# Patient Record
Sex: Female | Born: 1935 | Race: White | Hispanic: No | Smoking: Never smoker
Health system: Southern US, Community
[De-identification: ages and names within clinical notes are randomized; demographics above are authoritative.]

## PROBLEM LIST (undated history)

## (undated) DIAGNOSIS — C801 Malignant (primary) neoplasm, unspecified: Secondary | ICD-10-CM

## (undated) DIAGNOSIS — E785 Hyperlipidemia, unspecified: Secondary | ICD-10-CM

## (undated) DIAGNOSIS — E119 Type 2 diabetes mellitus without complications: Secondary | ICD-10-CM

## (undated) HISTORY — DX: Hyperlipidemia, unspecified: E78.5

## (undated) HISTORY — DX: Malignant (primary) neoplasm, unspecified: C80.1

## (undated) HISTORY — DX: Type 2 diabetes mellitus without complications: E11.9

---

## 1974-07-31 HISTORY — PX: BREAST LUMPECTOMY: SHX2

## 1990-09-15 DIAGNOSIS — C801 Malignant (primary) neoplasm, unspecified: Secondary | ICD-10-CM

## 1990-09-15 HISTORY — PX: EXPLORATORY LAPAROTOMY: SUR591

## 1990-09-15 HISTORY — DX: Malignant (primary) neoplasm, unspecified: C80.1

## 1990-09-15 HISTORY — PX: APPENDECTOMY: SHX54

## 1991-01-31 HISTORY — PX: ABDOMINAL HYSTERECTOMY: SHX81

## 2005-01-31 ENCOUNTER — Ambulatory Visit: Payer: Self-pay | Admitting: General Surgery

## 2005-01-31 ENCOUNTER — Other Ambulatory Visit: Payer: Self-pay

## 2005-02-17 ENCOUNTER — Ambulatory Visit: Payer: Self-pay | Admitting: Internal Medicine

## 2005-11-18 ENCOUNTER — Ambulatory Visit: Payer: Self-pay | Admitting: Unknown Physician Specialty

## 2006-09-15 HISTORY — PX: COLONOSCOPY: SHX174

## 2006-11-25 ENCOUNTER — Ambulatory Visit: Payer: Self-pay | Admitting: Family Medicine

## 2006-12-10 ENCOUNTER — Ambulatory Visit: Payer: Self-pay | Admitting: General Surgery

## 2007-06-08 ENCOUNTER — Ambulatory Visit: Payer: Self-pay | Admitting: Family Medicine

## 2007-12-09 ENCOUNTER — Ambulatory Visit: Payer: Self-pay | Admitting: Unknown Physician Specialty

## 2008-12-12 ENCOUNTER — Ambulatory Visit: Payer: Self-pay | Admitting: General Surgery

## 2008-12-19 ENCOUNTER — Inpatient Hospital Stay: Payer: Self-pay | Admitting: General Surgery

## 2008-12-19 HISTORY — PX: CHOLECYSTECTOMY: SHX55

## 2008-12-28 ENCOUNTER — Ambulatory Visit: Payer: Self-pay | Admitting: General Surgery

## 2009-01-08 ENCOUNTER — Ambulatory Visit: Payer: Self-pay | Admitting: General Surgery

## 2009-02-27 ENCOUNTER — Ambulatory Visit: Payer: Self-pay | Admitting: Family Medicine

## 2009-12-24 ENCOUNTER — Ambulatory Visit: Payer: Self-pay | Admitting: Vascular Surgery

## 2010-03-05 ENCOUNTER — Ambulatory Visit: Payer: Self-pay | Admitting: Family Medicine

## 2011-04-16 ENCOUNTER — Ambulatory Visit: Payer: Self-pay | Admitting: Family Medicine

## 2011-05-21 ENCOUNTER — Ambulatory Visit: Payer: Self-pay | Admitting: Family Medicine

## 2012-04-28 ENCOUNTER — Ambulatory Visit: Payer: Self-pay | Admitting: Family Medicine

## 2012-08-04 ENCOUNTER — Ambulatory Visit: Payer: Self-pay | Admitting: General Surgery

## 2014-03-03 IMAGING — CT CT ABD-PELV W/O CM
1 of 2 series · 15 of 32 positions shown, 19 images · non-contrast
Comparison: none

REASON FOR EXAM: abd pain generalized ORAL CONTRAST ONLY
COMMENTS:

PROCEDURE:     KCT - KCT ABDOMEN/PELVIS WO  - August 04, 2012  [DATE]
RESULT:     Comparison: None
TECHNIQUE: Multiple axial images from the lung bases to the symphysis pubis
were obtained without oral and without intravenous contrast.

[Series 2: abd 3mm wo 3.0 i40f 3 · axial · 0.75mm/px · z∈[-922,-586]mm · 15 of 127 slices shown, 19 images]
[im 10/127  soft-tissue]
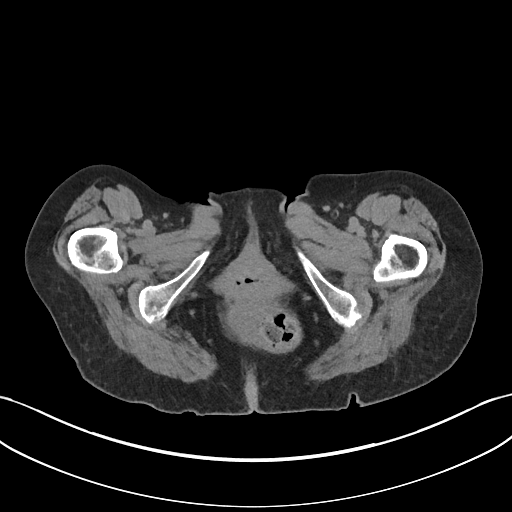
[im 10/127  bone]
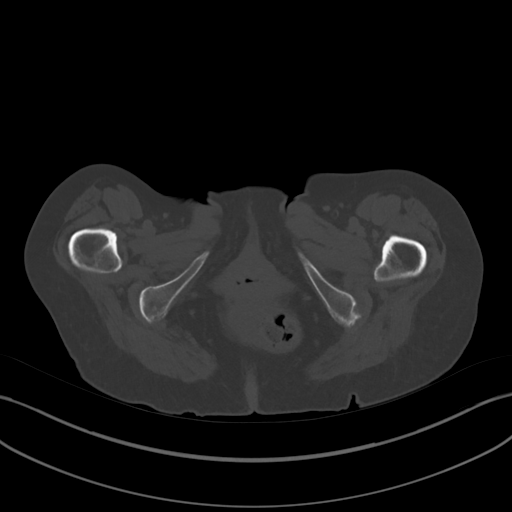
[im 19/127  soft-tissue]
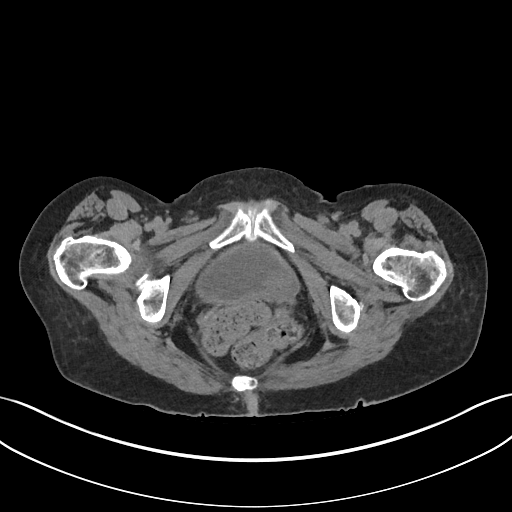
[im 28/127  soft-tissue]
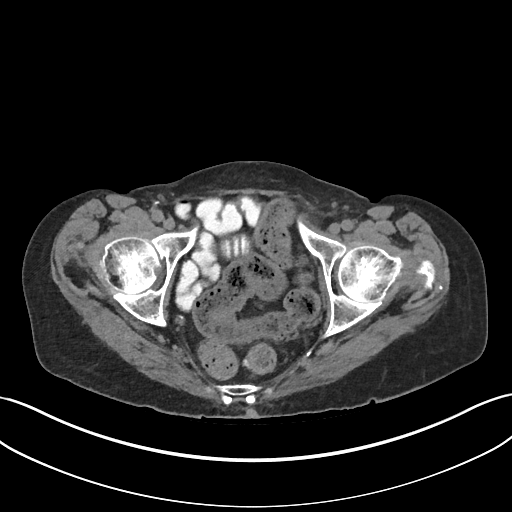
[im 37/127  soft-tissue]
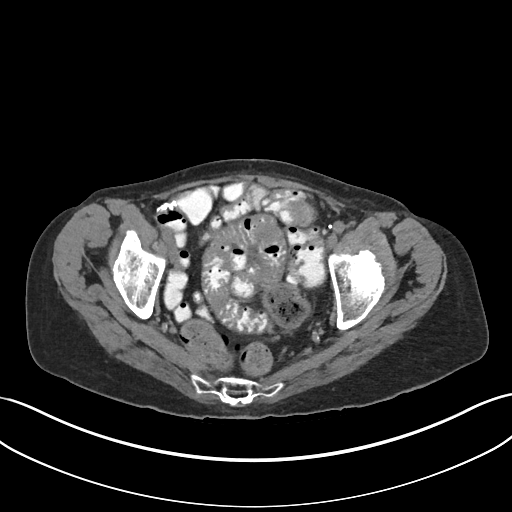
[im 46/127  soft-tissue]
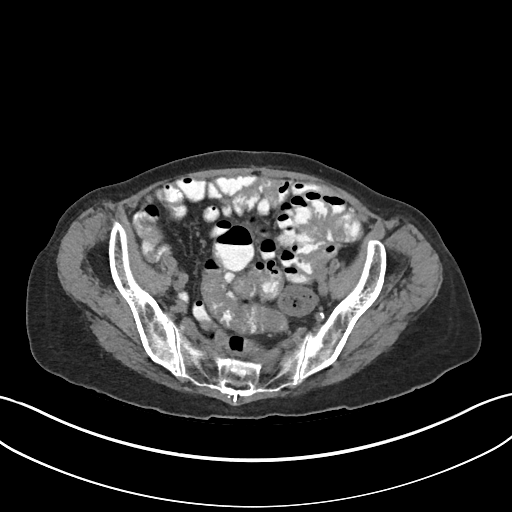
[im 55/127  soft-tissue]
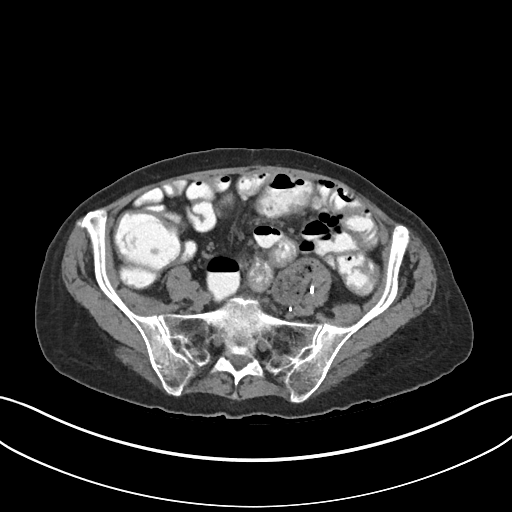
[im 64/127  soft-tissue]
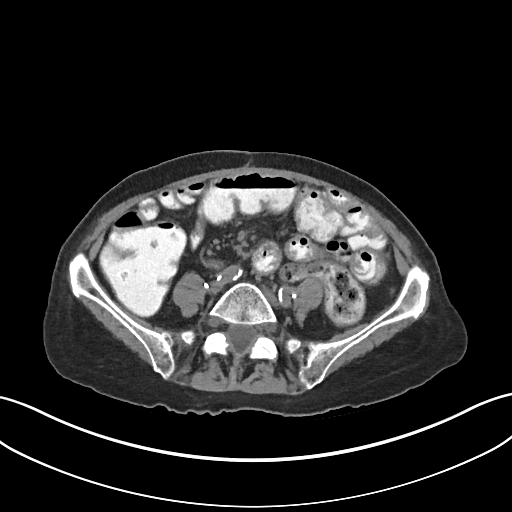
[im 73/127  soft-tissue]
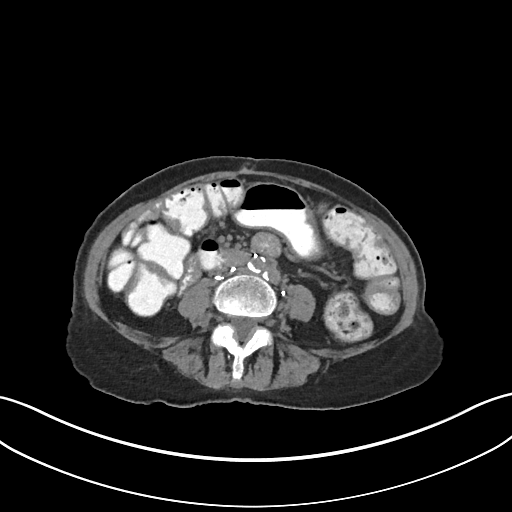
[im 82/127  soft-tissue]
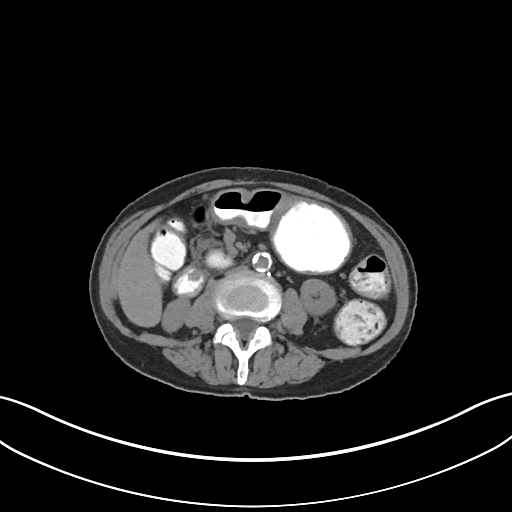
[im 82/127  bone]
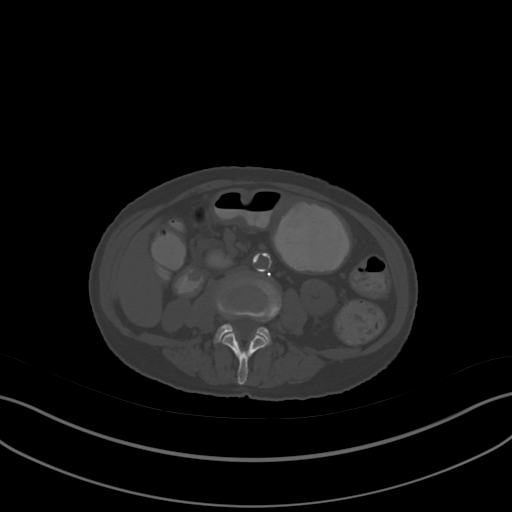
[im 91/127  soft-tissue]
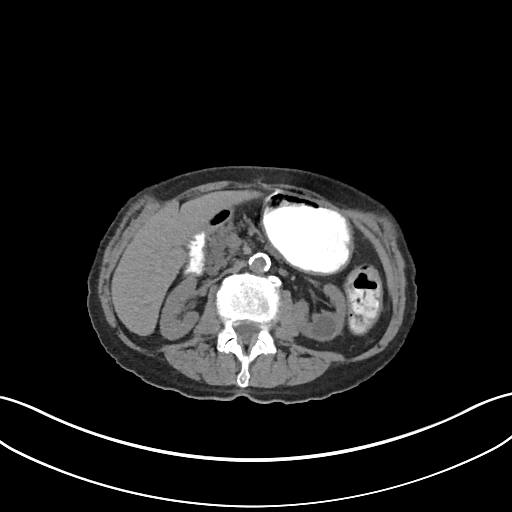
[im 100/127  soft-tissue]
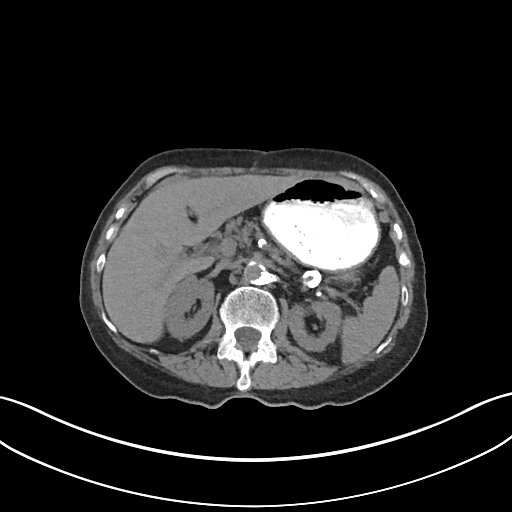
[im 109/127  soft-tissue]
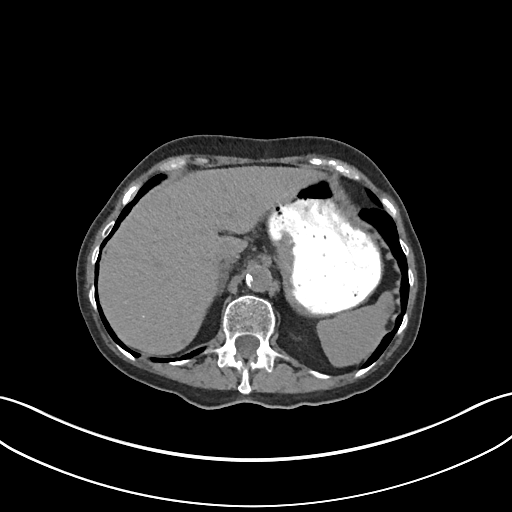
[im 109/127  lung]
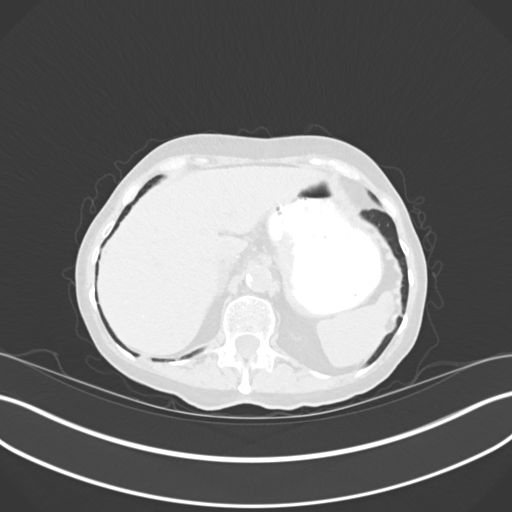
[im 113/127  lung]
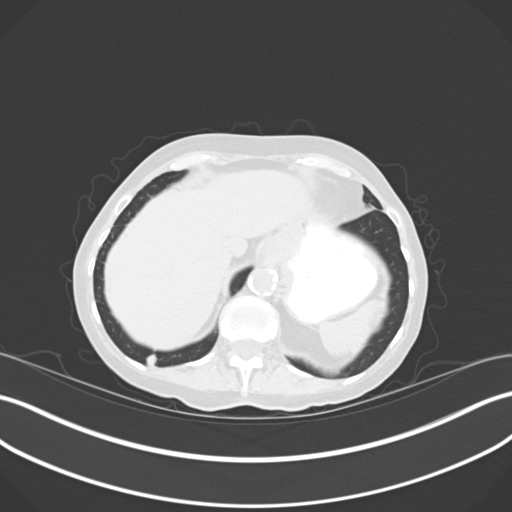
[im 118/127  soft-tissue]
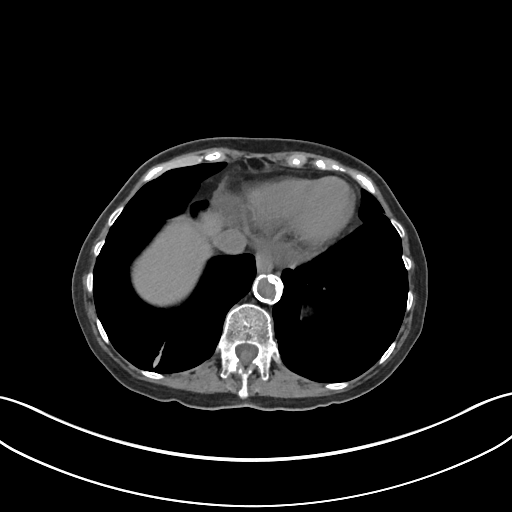
[im 118/127  lung]
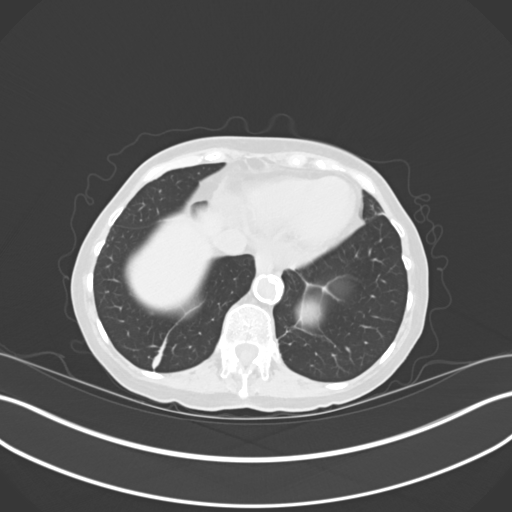
[im 122/127  lung]
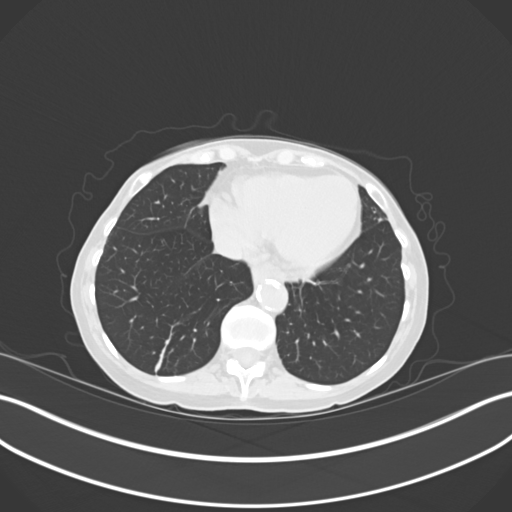

[15 of 32 positions shown; findings below may reference images not displayed]

FINDINGS: Linear opacity in the left lower lobe is likely secondary to atelectasis.

Lack of intravenous contrast limits evaluation of the solid abdominal
organs.  Punctate calcifications in the liver likely represent sequela of
old prior infection. Surgical clips are seen from prior cholecystectomy. The
spleen, adrenals, and pancreas are unremarkable. Small low-attenuation
lesion in the left kidney likely represents a cyst. No renal calculi or
hydronephrosis. There is a round peripherally calcified density just
superior to the pancreatic tail. This likely represents a periphery
calcified splenic artery aneurysm. It measures 1.3 cm in diameter.

Mild thickening at the gastroesophageal junction is likely secondary to
underdistention. The patient is status post hysterectomy. Stool is seen
throughout the sigmoid colon and rectum. The small and large bowel are
normal in caliber. Multiple surgical clips are seen in the anterior left
hemipelvis. There is a bowel suture line in the anterior mid abdomen. The
appendix is not visualized, consistent with reported history of
appendectomy. Multiple surgical clips are seen in the right anterior pelvis.
There is a broad-based right inguinal hernia containing a loop of
nonobstructed small bowel. There are a few diverticula in the sigmoid colon.

No aggressive lytic or sclerotic osseous lesions are identified.
IMPRESSION: 1. No acute findings in the abdomen or pelvis, given the limitation of lack
of intravenous contrast.
2. There is a broad-based right inguinal hernia containing nonobstructed
small bowel.
3. There are findings which likely represent a 1.3 cm peripherally calcified
splenic artery aneurysm.

## 2014-03-21 ENCOUNTER — Encounter: Payer: Self-pay | Admitting: General Surgery

## 2014-03-22 ENCOUNTER — Ambulatory Visit (INDEPENDENT_AMBULATORY_CARE_PROVIDER_SITE_OTHER): Payer: Medicare Other | Admitting: General Surgery

## 2014-03-22 ENCOUNTER — Encounter: Payer: Self-pay | Admitting: General Surgery

## 2014-03-22 VITALS — BP 132/62 | HR 72 | Temp 97.6°F | Resp 12 | Ht 62.0 in | Wt 110.0 lb

## 2014-03-22 DIAGNOSIS — R229 Localized swelling, mass and lump, unspecified: Secondary | ICD-10-CM

## 2014-03-22 DIAGNOSIS — D481 Neoplasm of uncertain behavior of connective and other soft tissue: Secondary | ICD-10-CM

## 2014-03-22 NOTE — Patient Instructions (Signed)
Keep area clean 

## 2014-03-22 NOTE — Progress Notes (Signed)
Patient ID: Emily Kramer, female   DOB: 10/03/1922, 80 y.o.   MRN: [id number]  Chief Complaint  Patient presents with  . Other    right leg abscess    HPI MERCEDE Kramer is a 80 y.o. female.  Here today for right lower leg abscess or cyst. She states it has been there for about 2 months. It started as a marble size and it has gotten larger. Clear liquid drained after she probed with a sterilized pin, but no further drainage has been noted. The surface was smooth prior to her admission. It has become more nodular since that time.  Denies any injury. She feels like it came up over night since she checks regularly for ticks.  The patient is accompanied today by her daughter,Emily Kramer, who was present for the interview and exam.  The patient was last seen in December 2013 regards to abdominal pain. Evaluation at that time was negative and she was felt to be at high-risk for laparoscopy. She reports that this has improved although was not completely resolved. Weight is up significantly since that visit.  HPI  Past Medical History  Diagnosis Date  . Diabetes mellitus without complication   . Hyperlipidemia   . Cancer 1992    ovarian, treated with chemotherapy at [facility]    Past Surgical History  Procedure Laterality Date  . Exploratory laparotomy  1992    Dr. Cindie Laroche. [facility]  . Appendectomy  1992    Dr Clarene Essex. [facility]  . Breast lumpectomy Right 07/31/74  . Abdominal hysterectomy  01/31/91    Dr. Vernie Ammons  . Cholecystectomy  12/19/2008    Dr. Bary Castilla  . Colonoscopy  2008    Dr Bary Castilla    Family History  Problem Relation Age of Onset  . Lung cancer Father   . Multiple myeloma Mother   . Pancreatic cancer Sister   . Cancer Brother     Social History History  Substance Use Topics  . Smoking status: Never Smoker   . Smokeless tobacco: Never Used  . Alcohol Use: 0.6 oz/week    1 Glasses of wine per week     Comment: wine    No Known Allergies  Current Outpatient  Prescriptions  Medication Sig Dispense Refill  . AGGRENOX 25-200 MG per 12 hr capsule Take by mouth 2 (two) times daily.      Marland Kitchen b complex vitamins tablet Take 1 tablet by mouth daily.      Marland Kitchen losartan (COZAAR) 50 MG tablet Take 50 mg by mouth daily.      . meclizine (ANTIVERT) 25 MG tablet Take 25 mg by mouth 3 (three) times daily as needed for dizziness.      . Metoprolol-Hydrochlorothiazide 50-12.5 MG TB24 Take 2 tablets by mouth daily.      . Multiple Vitamin (MULTIVITAMIN) capsule Take 1 capsule by mouth daily.      . niacin 500 MG tablet Take 500 mg by mouth at bedtime.      Marland Kitchen VITAMIN D, ERGOCALCIFEROL, PO Take by mouth daily.       No current facility-administered medications for this visit.    Review of Systems Review of Systems  Constitutional: Negative.   Respiratory: Negative.   Cardiovascular: Negative.     Blood pressure 132/62, pulse 72, temperature 97.6 F (36.4 C), temperature source Oral, resp. rate 12, height 5' 2"  (1.575 m), weight 110 lb (49.896 kg).  Physical Exam Physical Exam  Constitutional: She is oriented to person,  place, and time. She appears well-developed and well-nourished.  Neck: Neck supple.  Cardiovascular: Normal rate, regular rhythm and normal heart sounds.   Pulses:      Dorsalis pedis pulses are 2+ on the right side.       Posterior tibial pulses are 2+ on the right side.  No lower leg edema.  Pulmonary/Chest: Effort normal and breath sounds normal.  Musculoskeletal:       Legs: Lymphadenopathy:    She has no cervical adenopathy.       Right: No inguinal adenopathy present.  Neurological: She is alert and oriented to person, place, and time.  Skin: Skin is warm and dry.  Area just below right tibial tubical 2.5 x 3 cm nodular mass    Data Reviewed PCP notes dated March 06, 2014.  The patient's weight is up 9 pounds from her December 2013 exam.  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     Plan    There  may be just enough skin for tension free primary closure. I recommended that she have a punch biopsy to rule out malignancy prior to extensive mobilization. She was amenable, and this was completed using 3 cc of 0.5% Xylocaine with 0.25% Marcaine with 1-200,000 units of epinephrine. A 2.5 mm single punch biopsy was obtained from the 5:30 o'clock position to the periphery of the nodule. Scant bleeding was noted. Skin defect was closed with a single 4-0 nylon suture. Telfa and Tegaderm dressing applied. Postoperative wound instructions reviewed with the patient and her daughter.  She be contacted when the pathology result is available.     PCP: Etheleen Mayhew 03/22/2014, 8:50 PM

## 2014-03-24 LAB — PATHOLOGY

## 2014-03-25 ENCOUNTER — Telehealth: Payer: Self-pay | Admitting: General Surgery

## 2014-03-25 NOTE — Telephone Encounter (Signed)
The patient was notified of the biopsy results were benign.  Options for management were reviewed: 1) excision for management versus 2) dermatological assessment.  The patient reports that she "wants the area off". Considering the size and location and the likely need to mobilize tissue this is probably better handle tear then with the dermatologist.  She'll be contacted on July 13 to arrange for a time for excision.  The patient declined to have may call her daughter with this information.

## 2014-03-27 ENCOUNTER — Telehealth: Payer: Self-pay

## 2014-03-27 NOTE — Telephone Encounter (Signed)
Message copied by Lesly Rubenstein on Mon Mar 27, 2014  8:52 AM ------      Message from: Robert Bellow      Created: Sat Mar 25, 2014  9:42 AM       Please arrange a 30 minute appointment for excision. Thank you      ----- Message -----         From: [facility] Lab Results In Interface         Sent: 03/24/2014   4:39 PM           To: Robert Bellow, MD                   ------

## 2014-03-27 NOTE — Telephone Encounter (Signed)
Message left for patient to call back to schedule excision of lesion from her right lower leg.

## 2014-03-27 NOTE — Telephone Encounter (Signed)
Patient is scheduled for excision of right leg on 04/13/14 at 10:30 am.

## 2014-03-29 ENCOUNTER — Ambulatory Visit (INDEPENDENT_AMBULATORY_CARE_PROVIDER_SITE_OTHER): Payer: Self-pay | Admitting: *Deleted

## 2014-03-29 DIAGNOSIS — R229 Localized swelling, mass and lump, unspecified: Secondary | ICD-10-CM

## 2014-03-29 NOTE — Progress Notes (Signed)
Patient came in today for a wound check.  Suture removed. The wound is clean, with no signs of infection noted. Follow up as scheduled for excision.

## 2014-03-29 NOTE — Patient Instructions (Signed)
-

## 2014-04-13 ENCOUNTER — Ambulatory Visit: Payer: Self-pay | Admitting: General Surgery

## 2014-04-19 ENCOUNTER — Ambulatory Visit: Payer: Medicare Other | Admitting: General Surgery

## 2014-05-16 DEATH — deceased

## 2014-07-17 ENCOUNTER — Encounter: Payer: Self-pay | Admitting: General Surgery
